# Patient Record
Sex: Male | Born: 1995 | Race: Black or African American | Hispanic: No | Marital: Single | Smoking: Never smoker
Health system: Southern US, Community
[De-identification: ages and names within clinical notes are randomized; demographics above are authoritative.]

## PROBLEM LIST (undated history)

## (undated) DIAGNOSIS — F909 Attention-deficit hyperactivity disorder, unspecified type: Secondary | ICD-10-CM

## (undated) DIAGNOSIS — D573 Sickle-cell trait: Secondary | ICD-10-CM

---
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
## 2014-05-25 ENCOUNTER — Telehealth: Payer: Self-pay | Admitting: Licensed Clinical Social Worker

## 2014-05-25 MED ORDER — LISDEXAMFETAMINE DIMESYLATE 40 MG PO CAPS: 40.0000 mg | ORAL_CAPSULE | Freq: Every day | ORAL | Status: DC

## 2014-05-25 NOTE — Telephone Encounter (Signed)
Will refill x 1 but please inform patient that future refills will require a visit.

## 2014-05-25 NOTE — Telephone Encounter (Signed)
TC from mother requesting refill of Vyvanse 40mg  for patient. Appointment made for next available date of 06/17/14 and added to cancellation list. Mother stated that patient has 1 week of medication left and wanted to know if a prescription can be written to hold patient until next appointment.

## 2014-05-25 NOTE — Addendum Note (Signed)
Addended by: Delorse LekPERRY, Melayah Skorupski F on: 05/25/2014 08:18 PM   Modules accepted: Orders, Medications

## 2014-05-26 ENCOUNTER — Telehealth: Payer: Self-pay

## 2014-05-26 NOTE — Telephone Encounter (Signed)
Called and left mom a VM that rx is ready for pick up and future prescriptions will require a follow up appointment.  He is scheduled for a follow up on 11/4 @ 2:30-gave reminder.

## 2014-06-17 ENCOUNTER — Ambulatory Visit: Payer: Medicaid Other | Admitting: Pediatrics

## 2014-06-23 ENCOUNTER — Encounter: Payer: Self-pay | Admitting: Pediatrics

## 2014-06-23 NOTE — Progress Notes (Signed)
Pre-Visit Planning  Previous Psych Screenings:  Vanderbilt completed by parent 05/27/13 Adult [name] ADD Scales  Completed on: 12/25/13 Cluster Subtotals: Activation: 23, T-Score 87 Attention: 22, T-Score 82 Effort: 26, T-Score 98 Affect: 11, T-Score 67 Memory: 14, T-Score 82 Total Score: 96, T-Score 91  Review of previous notes:  Last seen by Dr. Marina GoodellPerry on 12/25/13.  Treatment plan at last visit included continuing vyvanse, starting clonidine for sleep, retin-a for beard issues, claritin and flonase for anosmia. Has no-showed multiple appointments since then.   Last CPE: Due  Last STI screen: None Pertinent Labs: None  Immunizations Due: Flu Psych Screenings Due: ASRS  To Do at visit:   -ENT referral if continued anosmia -assess ADHD symptoms and school -sleep? -gc/chlamydia

## 2014-06-25 ENCOUNTER — Encounter: Payer: Self-pay | Admitting: Pediatrics

## 2014-06-25 ENCOUNTER — Ambulatory Visit (INDEPENDENT_AMBULATORY_CARE_PROVIDER_SITE_OTHER): Payer: Medicaid Other | Admitting: Pediatrics

## 2014-06-25 VITALS — BP 118/80 | Wt 224.4 lb

## 2014-06-25 DIAGNOSIS — F902 Attention-deficit hyperactivity disorder, combined type: Secondary | ICD-10-CM

## 2014-06-25 DIAGNOSIS — G479 Sleep disorder, unspecified: Secondary | ICD-10-CM

## 2014-06-25 MED ORDER — CLONIDINE HCL 0.1 MG PO TABS: 0.2000 mg | ORAL_TABLET | Freq: Every day | ORAL | Status: DC

## 2014-06-25 MED ORDER — LISDEXAMFETAMINE DIMESYLATE 50 MG PO CAPS: 50.0000 mg | ORAL_CAPSULE | Freq: Every day | ORAL | Status: DC

## 2014-06-25 NOTE — Progress Notes (Signed)
11:49 AM  Adolescent Medicine Consultation Follow-Up Visit Calvin Schneider  is a 18 y.o. male referred by Dr. Allayne GitelmanKavanaugh here today for follow-up of ADHD.   PCP Confirmed?  yes  Angelina PihKAVANAUGH,ALISON S, MD   History was provided by the patient and mother.   Expand All Collapse All   Pre-Visit Planning  Previous Psych Screenings: Vanderbilt completed by parent 05/27/13 Adult [name] ADD Scales  Completed on: 12/25/13 Cluster Subtotals: Activation: 23, T-Score 87 Attention: 22, T-Score 82 Effort: 26, T-Score 98 Affect: 11, T-Score 67 Memory: 14, T-Score 82 Total Score: 96, T-Score 91  Review of previous notes:  Last seen by Dr. Marina GoodellPerry on 12/25/13. Treatment plan at last visit included continuing vyvanse, starting clonidine for sleep, retin-a for beard issues, claritin and flonase for anosmia. Has no-showed multiple appointments since then.   Last CPE: Due  Last STI screen: None Pertinent Labs: None  Immunizations Due: Flu Psych Screenings Due: ASRS  To Do at visit:  -ENT referral if continued anosmia -assess ADHD symptoms and school -sleep? -gc/chlamydia         Growth Chart Viewed? not applicable  HPI:  Pt reports that he hasn't been needing his medicine since he hasn't been in school. He is starting school again next semester and anticipates taking it then. He will take entrance exams to start. When he was taking medications he was taking 2 of the 40 mg Vyvanse pills and 2 clonidine at night. He says he was unaware that it was written to be ONE daily. We discussed the importance of not taking 2.   He is not sleeping well. He is staying up all night watching TV. Goes to bed at 2-3 am. Sleeps till around 8 am. Doesn't have good sleep hygeine. Hoping to get back on a better schedule before school starts.   Mom feels like he needs to take more initiative to clean his room and fix meals for himself. He is not working right now. Mom hopes that he will get a job and be more  productive around the house so he can help her with bills now that he is over 18.  He wants to drive but mom is worried about his ADHD not being under control to be able to do that. We agreed to re-evaluate him in 1 month to see how things are with his ADHD.      ASRS: Off medications Top: 3/6 Bottom: 10/12   No LMP for male patient.  ROS:  Review of Systems  Constitutional: Negative for weight loss and malaise/fatigue.  Eyes: Negative for blurred vision.  Respiratory: Negative for shortness of breath.   Cardiovascular: Negative for chest pain and palpitations.  Gastrointestinal: Negative for nausea, vomiting, abdominal pain and constipation.  Genitourinary: Negative for dysuria.  Musculoskeletal: Negative for myalgias.  Neurological: Negative for dizziness and headaches.  Psychiatric/Behavioral: Negative for depression.     The following portions of the patient's history were reviewed and updated as appropriate: allergies, current medications, past social history and problem list.  No Known Allergies  Social History: Sleep:  As above Eating Habits: salads sometimes, some fruit  Screen Time:  A lot Exercise: lots of exercise. Plays basketball, works out and walks  School: Starting in January at [employer]  Future Plans: As above.   Confidentiality was discussed with the patient and if applicable, with caregiver as well.  Patient's personal or confidential phone number: [phone] Tobacco? no Secondhand smoke exposure?yes, a friend he spends a lot of time with  Drugs/EtOH?yes, recreational ETOH, denies drug use  Sexually active?no Pregnancy Prevention: , reviewed condoms & plan B Safe at home, in school & in relationships? Yes Guns in the home? no Safe to self? Yes  Physical Exam:  Filed Vitals:   06/25/14 1144  BP: 118/80  Weight: 224 lb 6.4 oz (101.787 kg)   BP 118/80 mmHg  Wt 224 lb 6.4 oz (101.787 kg) Body mass index: body mass index is 28.8 kg/(m^2). No  height on file for this encounter.  Physical Exam  Constitutional: He is oriented to person, place, and time. He appears well-developed and well-nourished.  Cardiovascular: Normal rate and regular rhythm.   Abdominal: Soft. Bowel sounds are normal.  Musculoskeletal: Normal range of motion.  Neurological: He is alert and oriented to person, place, and time.  Skin: Skin is warm and dry.  Psychiatric: He has a normal mood and affect. His behavior is normal.    Assessment/Plan: 1. Attention deficit hyperactivity disorder (ADHD), combined type He was taking 2 40 mg Vyvanse pills prior to them running out which I explained was too high of a dose. We will increase slightly from 40 mg to 50 mg to hopefully have appropriate effect but I have told him it must ONLY be once a day.  - lisdexamfetamine (VYVANSE) 50 MG capsule; Take 1 capsule (50 mg total) by mouth daily.  Dispense: 30 capsule; Refill: 0  2. Sleep disturbance He was also taking 2 clonidine at night instead of BID so we will change it to be dosed that way.  - lisdexamfetamine (VYVANSE) 50 MG capsule; Take 1 capsule (50 mg total) by mouth daily.  Dispense: 30 capsule; Refill: 0 - cloNIDine (CATAPRES) 0.1 MG tablet; Take 2 tablets (0.2 mg total) by mouth at bedtime.  Dispense: 60 tablet; Refill: 3  He needs a gc/chlamydia screen at his next appointment. It doesn't appear that this had been done here and he wasn't able to pee in a cup today for us.    Follow-up:  1 month with Dr. Marina GoodellPerry for follow up of medications   Medical decision-making:  > 25 minutes spent, more than 50% of appointment was spent discussing diagnosis and management of symptoms

## 2014-06-25 NOTE — Patient Instructions (Signed)
Take your Vyvanse once in the morning. Don't take more than one.   Take the Clonidine at night. You may take two pills. Don't take it during the day.   Discussed importance of diet, exercise, sleep and limited screen time.

## 2014-06-26 ENCOUNTER — Telehealth: Payer: Self-pay

## 2014-06-26 NOTE — Telephone Encounter (Signed)
Calvin Schneider with [facility] on [address] called stating the PHARMACY misplaced [name] rx for Vyvanse 40 mg.  He would like a call back ASAP @ [phone].  Please advise.

## 2014-06-26 NOTE — Telephone Encounter (Signed)
They need to file a police report and call with the file number before we can replace it.  Please notify them.

## 2014-06-30 NOTE — Telephone Encounter (Signed)
Called and left a VM for Kathlene NovemberMike that a police file number is required.  Please call us with that file number and we can issue a new rx for the Vyvanse 40 mg.

## 2014-06-30 NOTE — Telephone Encounter (Signed)
Huntley DecSara called stating the patient had picked up the prescription on 11/12 and she will clarify the call with Kathlene NovemberMike.

## 2014-07-22 ENCOUNTER — Encounter: Payer: Self-pay | Admitting: Pediatrics

## 2014-07-22 NOTE — Progress Notes (Signed)
Pre-Visit Planning  Previous Psych Screenings:  Previous Psych Screenings: Vanderbilt completed by parent 05/27/13 Adult [name] ADD Scales  Completed on: 12/25/13 Cluster Subtotals: Activation: 23, T-Score 87 Attention: 22, T-Score 82 Effort: 26, T-Score 98 Affect: 11, T-Score 67 Memory: 14, T-Score 82 Total Score: 96, T-Score 91  Psych Screenings Due: ASRS if he is taking Vyvanse   Review of previous notes:  [facility] on 06[facility]25/14.  Treatment plan at last visit included restarting vyvanse and clonidine since he was starting school soon. According to a phone call he or the pharmacy evidently "lost" the prescription. Not sure what the resolution of this was.    Last CPE: Due  Last STI screen: Due Pertinent Labs: None  Immunizations Due: None  To Do at visit:  Discuss medication effect if he is taking it. Gc/chlamydia screening. Schedule CPE.

## 2014-07-23 ENCOUNTER — Ambulatory Visit: Payer: Medicaid Other | Admitting: Pediatrics

## 2014-07-29 ENCOUNTER — Encounter: Payer: Self-pay | Admitting: Licensed Clinical Social Worker

## 2014-07-29 ENCOUNTER — Encounter: Payer: Self-pay | Admitting: Pediatrics

## 2014-07-29 ENCOUNTER — Ambulatory Visit (INDEPENDENT_AMBULATORY_CARE_PROVIDER_SITE_OTHER): Payer: Medicaid Other | Admitting: Pediatrics

## 2014-07-29 VITALS — BP 118/78 | Ht 74.5 in | Wt 223.0 lb

## 2014-07-29 DIAGNOSIS — F902 Attention-deficit hyperactivity disorder, combined type: Secondary | ICD-10-CM

## 2014-07-29 DIAGNOSIS — Z1389 Encounter for screening for other disorder: Secondary | ICD-10-CM

## 2014-07-29 DIAGNOSIS — Z113 Encounter for screening for infections with a predominantly sexual mode of transmission: Secondary | ICD-10-CM

## 2014-07-29 DIAGNOSIS — Z23 Encounter for immunization: Secondary | ICD-10-CM

## 2014-07-29 DIAGNOSIS — G479 Sleep disorder, unspecified: Secondary | ICD-10-CM

## 2014-07-29 MED ORDER — LISDEXAMFETAMINE DIMESYLATE 60 MG PO CAPS: 60.0000 mg | ORAL_CAPSULE | ORAL | Status: DC

## 2014-07-29 NOTE — Progress Notes (Signed)
Met with patient and mother briefly (about 10 minutes) to discuss resources in the community. Mother and patient wanted information on job resources. Provided information on [employer] job preparedness training as well as Psychiatrist.   Also discussed options for counseling agencies. Mother wanted referral to [facility]. [facility] explained that [facility] does not take patient's insurance. [facility] offered as alternative as family therapy can be provided and they accept Medicaid. Mother and patient agreed to plan. Referral sent.

## 2014-07-29 NOTE — Progress Notes (Signed)
11:38 AM  Adolescent Medicine Consultation Follow-Up Visit Calvin Schneider  is a 18 y.o. male referred by Dr. Allayne GitelmanKavanaugh here today for follow-up of ADHD.   PCP Confirmed?  yes  Angelina PihKAVANAUGH,ALISON S, MD   History was provided by the patient and mother.  Pre-Visit Planning  Previous Psych Screenings: Previous Psych Screenings: Vanderbilt completed by parent 05/27/13 Adult [name] ADD Scales  Completed on: 12/25/13 Cluster Subtotals: Activation: 23, T-Score 87 Attention: 22, T-Score 82 Effort: 26, T-Score 98 Affect: 11, T-Score 67 Memory: 14, T-Score 82 Total Score: 96, T-Score 91  Psych Screenings Due: ASRS if he is taking Vyvanse   Review of previous notes:  Last seen in [facility] on 06/25/14. Treatment plan at last visit included restarting vyvanse and clonidine since he was starting school soon. According to a phone call he or the pharmacy evidently "lost" the prescription. Not sure what the resolution of this was.   Last CPE: Due  Last STI screen: Due Pertinent Labs: None  Immunizations Due: None  To Do at visit: Discuss medication effect if he is taking it. Gc/chlamydia screening. Schedule CPE.   Growth Chart Viewed? not applicable  HPI:  Pt reports that since the last time we saw him the medicine was working really well. When he takes the clonidine it's working well when he remembers to take it.    He still took 2 of the 50 mg pills a few times. He reports that his focus was better. He says he understands that he shouldn't take more than one but he has done this again anyway. We discussed that this is not a safe practice.   He still hasn't practiced driving. Mom won't let him because she is worried about teen drivers. He is using his focus for his own personal purposes but not to help mom. Mom feels like they need help from a counselor so she can have some parenting skills and know how to help him get motivated.    No LMP for male patient.  ROS:   Review of Systems  Constitutional: Negative for weight loss and malaise/fatigue.  Eyes: Negative for blurred vision.  Respiratory: Negative for shortness of breath.   Cardiovascular: Negative for chest pain and palpitations.  Gastrointestinal: Negative for nausea, vomiting, abdominal pain and constipation.  Genitourinary: Negative for dysuria.  Musculoskeletal: Negative for myalgias.  Neurological: Negative for dizziness and headaches.  Psychiatric/Behavioral: Negative for depression.     The following portions of the patient's history were reviewed and updated as appropriate: allergies, current medications, past family history, past medical history, past social history and problem list.  No Known Allergies  Social History: Sleep:  Sleeping well when he takes clonidine  Screen Time:  Excessive video games School: Working on applying Future Plans: Unsure  Physical Exam:  Filed Vitals:   07/29/14 1122  BP: 118/78  Height: 6' 2.5" (1.892 m)  Weight: 223 lb (101.152 kg)   BP 118/78 mmHg  Ht 6' 2.5" (1.892 m)  Wt 223 lb (101.152 kg)  BMI 28.26 kg/m2 Body mass index: body mass index is 28.26 kg/(m^2). Blood pressure percentiles are 23% systolic and 51% diastolic based on 2000 NHANES data. Blood pressure percentile targets: 90: 140/92, 95: 143/97, 99 + 5 mmHg: 156/110.  Physical Exam  Constitutional: He is oriented to person, place, and time. He appears well-developed.  HENT:  Head: Normocephalic.  Neck: No thyromegaly present.  Cardiovascular: Normal rate, regular rhythm, normal heart sounds and intact distal pulses.   Pulmonary/Chest:  Effort normal and breath sounds normal.  Abdominal: Soft. Bowel sounds are normal.  Musculoskeletal: Normal range of motion.  Lymphadenopathy:    He has no cervical adenopathy.  Neurological: He is alert and oriented to person, place, and time.  Skin: Skin is warm and dry.  Psychiatric: He has a normal mood and affect.  Vitals  reviewed.   Assessment/Plan: 1. Attention deficit hyperactivity disorder (ADHD), combined type BH referral to counseling. Increase medication slightly. Discussed not taking more than one. Discussed that meds will not help with motivation and be used for tasks like driving and working or school.  - lisdexamfetamine (VYVANSE) 60 MG capsule; Take 1 capsule (60 mg total) by mouth every morning.  Dispense: 30 capsule; Refill: 0 - Ambulatory referral to Behavioral Health  2. Sleep disturbance Continue clonidine and take more consistently.   3. Screen for STD (sexually transmitted disease) Per clinic policy.  - GC/chlamydia probe amp, urine  4. Screening for genitourinary condition Per clinic policy for other substances.  - Drug Screen, Urine  5. Need for prophylactic vaccination and inoculation against influenza Per flu season policy.  - Flu vaccine nasal quad   Follow-[facility]:  [facility]   Medical decision-making:  > 25 minutes spent, more than 50% of appointment was spent discussing diagnosis and management of symptoms

## 2014-07-29 NOTE — Patient Instructions (Addendum)
We will see you back again in a month. Calvin Schneider will help you get scheduled with [facility].   We have refilled your medication. It is really important for your health that you only take one pill at a time. It can cause severe heart problems if you take too much.   Keep working toward your goal of school or work!

## 2014-07-30 ENCOUNTER — Encounter: Payer: Self-pay | Admitting: Pediatrics

## 2014-07-30 LAB — DRUG SCREEN, URINE
Amphetamine Screen, Ur: NEGATIVE
BENZODIAZEPINES.: NEGATIVE
Barbiturate Quant, Ur: NEGATIVE
Cocaine Metabolites: NEGATIVE
Creatinine,U: 441.09 mg/dL
MARIJUANA METABOLITE: NEGATIVE
METHADONE: NEGATIVE
OPIATES: NEGATIVE
PHENCYCLIDINE (PCP): NEGATIVE
Propoxyphene: NEGATIVE

## 2014-07-30 LAB — GC/CHLAMYDIA PROBE AMP, URINE
Chlamydia, Swab/Urine, PCR: NEGATIVE
GC Probe Amp, Urine: NEGATIVE

## 2014-08-28 ENCOUNTER — Ambulatory Visit: Payer: Medicaid Other | Admitting: Pediatrics

## 2014-10-08 ENCOUNTER — Ambulatory Visit: Payer: Medicaid Other | Admitting: Pediatrics

## 2014-11-13 ENCOUNTER — Other Ambulatory Visit: Payer: Self-pay | Admitting: *Deleted

## 2014-11-13 NOTE — Telephone Encounter (Signed)
Patient has had two no shows. We will not refill medication at this time until he comes for his scheduled visit on 4/14. Please remind patient of this appointment and relay this to him.

## 2014-11-13 NOTE — Telephone Encounter (Signed)
CALL BACK NUMBER:  [phone]  MEDICATION(S): lisdexamfetamine (VYVANSE) 60 MG capsule  [facility] [address]  ARE YOU CURRENTLY COMPLETELY OUT OF THE MEDICATION? :  yes

## 2014-11-16 NOTE — Telephone Encounter (Addendum)
TC to patient. VM left that pt has had two no shows. We will not refill medication at this time until he comes for his scheduled visit on 4/14. Reminded patient of this appointment, callback given.

## 2014-11-26 ENCOUNTER — Encounter: Payer: Self-pay | Admitting: Pediatrics

## 2014-11-26 ENCOUNTER — Encounter (INDEPENDENT_AMBULATORY_CARE_PROVIDER_SITE_OTHER): Payer: Self-pay

## 2014-11-26 ENCOUNTER — Ambulatory Visit (INDEPENDENT_AMBULATORY_CARE_PROVIDER_SITE_OTHER): Payer: Medicaid Other | Admitting: Pediatrics

## 2014-11-26 VITALS — BP 128/82 | Ht 74.41 in | Wt 231.0 lb

## 2014-11-26 DIAGNOSIS — G479 Sleep disorder, unspecified: Secondary | ICD-10-CM | POA: Diagnosis not present

## 2014-11-26 DIAGNOSIS — F902 Attention-deficit hyperactivity disorder, combined type: Secondary | ICD-10-CM

## 2014-11-26 MED ORDER — CLONIDINE HCL 0.1 MG PO TABS: 0.2000 mg | ORAL_TABLET | Freq: Every day | ORAL | Status: DC

## 2014-11-26 MED ORDER — LISDEXAMFETAMINE DIMESYLATE 60 MG PO CAPS: 60.0000 mg | ORAL_CAPSULE | ORAL | Status: DC

## 2014-11-26 NOTE — Progress Notes (Signed)
Adolescent Medicine Consultation Follow-Up Visit Calvin Schneider  is a 19 y.o. male referred by Angelina Pih, MD here today for follow-up of ADHD.   PCP Confirmed?  Needs referral to [facility] for transition to adult medicine   Previsit planning completed:  yes  Growth Chart Viewed? yes   History was provided by the patient and mother.  HPI:  He has been off Vyvanse for a while. They didn't come to the last appointment because his mom and sister were sick. He has been working hard to get a job. He is going back next semester to [employer]. He is more thirsty and less hungry when taking Vyvanse. He has been using some marijuana and alcohol since he has been off Vyvanse but knows that he should refrain from these things when he is using his ADHD meds.   He is hopeful to get his license this summer. He has been helping more around the house. Mom feels like he is more mature.   No LMP for male patient.  The following portions of the patient's history were reviewed and updated as appropriate: allergies, current medications, past family history, past medical history, past social history and problem list.  No Known Allergies   Review of Systems  Constitutional: Negative for weight loss and malaise/fatigue.  Eyes: Negative for blurred vision.  Respiratory: Negative for shortness of breath.   Cardiovascular: Negative for chest pain and palpitations.  Gastrointestinal: Negative for nausea, vomiting, abdominal pain and constipation.  Genitourinary: Negative for dysuria.  Musculoskeletal: Negative for myalgias.  Neurological: Negative for dizziness and headaches.  Psychiatric/Behavioral: Negative for depression.     Social History: School: Trying to go to community college next semester Future Plans: college and work Sleep: sleeps through the night  Confidentiality was discussed with the patient and if applicable, with caregiver as well.  Patient's personal or confidential  phone number: [phone] Tobacco? no Drugs/ETOH? yes, marijuana, alcohol Sexually active? no Partner preference?  male Safe to self? Yes  Physical Exam:  Filed Vitals:   11/26/14 0913  BP: 128/82  Height: 6' 2.41" (1.89 m)  Weight: 231 lb (104.781 kg)   BP 128/82 mmHg  Ht 6' 2.41" (1.89 m)  Wt 231 lb (104.781 kg)  BMI 29.33 kg/m2 Body mass index: body mass index is 29.33 kg/(m^2). Blood pressure percentiles are 57% systolic and 59% diastolic based on 2000 NHANES data. Blood pressure percentile targets: 90: 140/94, 95: 144/98, 99 + 5 mmHg: 156/111.  Physical Exam  Constitutional: He is oriented to person, place, and time. He appears well-developed.  HENT:  Head: Normocephalic.  Neck: No thyromegaly present.  Cardiovascular: Normal rate, regular rhythm, normal heart sounds and intact distal pulses.   Pulmonary/Chest: Effort normal and breath sounds normal.  Musculoskeletal: Normal range of motion.  Lymphadenopathy:    He has no cervical adenopathy.  Neurological: He is alert and oriented to person, place, and time.  Skin: Skin is warm and dry.  Psychiatric: He has a normal mood and affect.  Vitals reviewed.    Assessment/Plan: 1. Attention deficit hyperactivity disorder (ADHD), combined type Continue Vyvanse 60 mg. Continue clonidine at bedtime. Could consider change to Intuniv if this doesn't seem to be the best combination.  - lisdexamfetamine (VYVANSE) 60 MG capsule; Take 1 capsule (60 mg total) by mouth every morning.  Dispense: 30 capsule; Refill: 0  2. Sleep disturbance Continue clonidine at bedtime.    Follow-up:  1 month   Medical decision-making:  > 15 minutes  spent, more than 50% of appointment was spent discussing diagnosis and management of symptoms

## 2014-11-26 NOTE — Patient Instructions (Addendum)
[facility]  [phone] [address] [place], [place] [address]  Please make an appointment for Calvin Schneider to have a physical. It appears to have been quite some time since he's had one. He will need to have one so we can continue to prescribe ADHD medications for him. Now that he is [age], it is a good time to transition to adult medicine.   We will see you back in 1 month to evaluate how you are doing. If you can keep this appointment and are doing well, we can space you out to 2 months in between.

## 2014-12-24 ENCOUNTER — Telehealth: Payer: Self-pay | Admitting: *Deleted

## 2014-12-24 ENCOUNTER — Ambulatory Visit: Payer: Medicaid Other | Admitting: Pediatrics

## 2014-12-24 NOTE — Telephone Encounter (Signed)
Mom called, requesting refill on Vyvanse.   Pt had NS appt this am, and has had three total NS appts since January.  F/u appt is scheduled for 5/27.

## 2015-01-08 ENCOUNTER — Ambulatory Visit: Payer: Medicaid Other | Admitting: Pediatrics

## 2015-01-08 ENCOUNTER — Encounter (INDEPENDENT_AMBULATORY_CARE_PROVIDER_SITE_OTHER): Payer: Self-pay

## 2015-01-14 ENCOUNTER — Encounter: Payer: Self-pay | Admitting: Pediatrics

## 2015-01-14 NOTE — Progress Notes (Signed)
Pre-Visit Planning  Review of previous notes:  Calvin Schneider  is a 19 y.o. male referred by Angelina PihKAVANAUGH,ALISON S, MD.   Last seen in [facility] on 11/26/14 for ADHD.  Treatment plan at last visit included continuing Vyvanse, clonidine at bedtime. Discussed importance of showing up for visits. Patient then no-showed again. Front desk has given them final warning.    Previous Psych Screenings?  yes Psych Screenings Due? Yes; ASRS, parent Vanderbilt   STI screen in the past year? yes Pertinent Labs? no  Clinical Staff Visit Tasks:   -ASRS & parent vanderbilt   Provider Visit Tasks: -? Job -license  -counseling referral ([facility] tried to contact)  -transition to adult care

## 2015-01-15 ENCOUNTER — Encounter (INDEPENDENT_AMBULATORY_CARE_PROVIDER_SITE_OTHER): Payer: Self-pay

## 2015-01-15 ENCOUNTER — Ambulatory Visit (INDEPENDENT_AMBULATORY_CARE_PROVIDER_SITE_OTHER): Payer: Medicaid Other | Admitting: Pediatrics

## 2015-01-15 ENCOUNTER — Encounter: Payer: Self-pay | Admitting: Pediatrics

## 2015-01-15 VITALS — BP 127/81 | HR 80 | Ht 74.0 in | Wt 224.0 lb

## 2015-01-15 DIAGNOSIS — G479 Sleep disorder, unspecified: Secondary | ICD-10-CM | POA: Diagnosis not present

## 2015-01-15 DIAGNOSIS — F902 Attention-deficit hyperactivity disorder, combined type: Secondary | ICD-10-CM | POA: Diagnosis not present

## 2015-01-15 MED ORDER — LISDEXAMFETAMINE DIMESYLATE 60 MG PO CAPS: 60.0000 mg | ORAL_CAPSULE | ORAL | Status: AC

## 2015-01-15 MED ORDER — LISDEXAMFETAMINE DIMESYLATE 60 MG PO CAPS
60.0000 mg | ORAL_CAPSULE | ORAL | Status: DC
Start: 2015-01-15 — End: 2015-01-15

## 2015-01-15 MED ORDER — GUANFACINE HCL ER 2 MG PO TB24: 2.0000 mg | ORAL_TABLET | Freq: Every day | ORAL | Status: AC

## 2015-01-15 NOTE — Patient Instructions (Addendum)
We will change your bedtime medicine to Intuniv. Take it consistently at the same time every night. If you are having dizziness when you stand up, please let us know. Make sure you are drinking enough water with this medicine.   We will see you in 2 months! Please make sure you are here for that appointment or cancel at least 24 hours before. Failure to show up for this appointment will result in dismissal from adolescent clinic due to prior no shows.   Guanfacine extended-release oral tablets What is this medicine? GUANFACINE [facility] fa seen) is used to treat attention-deficit hyperactivity disorder (ADHD). This medicine may be used for other purposes; ask your health care provider or pharmacist if you have questions. COMMON BRAND NAME(S): Intuniv What should I tell my health care provider before I take this medicine? They need to know if you have any of these conditions: -heart disease -kidney disease -liver disease -low blood pressure or slow heart rate -an unusual or allergic reaction to guanfacine, other medicines, foods, dyes, or preservatives -pregnant or breast-feeding How should I use this medicine? Take this medicine by mouth with a glass of water. Follow the directions on the prescription label. Do not cut, crush or chew this medicine. Do not take this medicine with a high-fat meal. Take your medicine at regular intervals. Do not take it more often than directed. Do not stop taking except on your doctor's advice. This drug may be prescribed for children as young as 6 years. Talk to your doctor if you have any questions. Overdosage: If you think you've taken too much of this medicine contact a poison control center or emergency room at once. Overdosage: If you think you have taken too much of this medicine contact a poison control center or emergency room at once. NOTE: This medicine is only for you. Do not share this medicine with others. What if I miss a dose? If you miss a dose,  take it as soon as you can. If it is almost time for your next dose, take only that dose. Do not take double or extra doses. If you miss 2 or more doses in a row, you should contact your doctor or health care professional. You may need to restart your medicine at a lower dose. What may interact with this medicine? -certain medicines for anxiety or sleep -certain medicines for blood pressure, heart disease, irregular heart beat -certain medicines for depression, anxiety, or psychotic disturbances -certain medicines for seizures like carbamazepine, phenobarbital, phenytoin -ketoconazole -narcotic medicines for pain -rifampin This list may not describe all possible interactions. Give your health care provider a list of all the medicines, herbs, non-prescription drugs, or dietary supplements you use. Also tell them if you smoke, drink alcohol, or use illegal drugs. Some items may interact with your medicine. What should I watch for while using this medicine? Visit your doctor or health care professional for regular checks on your progress. Check your heart rate and blood pressure as directed. Ask your doctor or health care professional what your heart rate and blood pressure should be and when you should contact him or her. You may get drowsy or dizzy. Do not ride a bicycle, drive, or do anything that needs mental alertness until you know how this medicine affects you. Do not stand or sit up quickly. This reduces the risk of dizzy or fainting spells. Avoid cough and cold medicines, alcohol, and activities that may make you drowsy or dizzy. Avoid becoming dehydrated or overheated  while taking this medicine. Your mouth may get dry. Chewing sugarless gum or sucking hard candy, and drinking plenty of water may help. Contact your doctor if the problem does not go away or is severe. What side effects may I notice from receiving this medicine? Side effects that you should report to your doctor or health care  professional as soon as possible: -allergic reactions like skin rash, itching or hives, swelling of the face, lips, or tongue -changes in emotions or moods -chest pain or chest tightness -signs and symptoms of low blood pressure like dizziness; feeling faint or lightheaded, falls; unusually weak or tired -unusually slow heartbeat Side effects that usually do not require medical attention (Report these to your doctor or health care professional if they continue or are bothersome.): -dizziness -dry mouth -irritability -headache -loss of appetite -nausea, vomiting -stomach pain -tiredness -trouble sleeping This list may not describe all possible side effects. Call your doctor for medical advice about side effects. You may report side effects to FDA at 1-800-FDA-1088. Where should I keep my medicine? Keep out of the reach of children. Store at room temperature between 15 and 30 degrees C (59 and 86 degrees F). Throw away any unused medicine after the expiration date. NOTE: This sheet is a summary. It may not cover all possible information. If you have questions about this medicine, talk to your doctor, pharmacist, or health care provider.  2015, [employer]. (2013-07-09 12:53:57)

## 2015-01-15 NOTE — Progress Notes (Signed)
Adolescent Medicine Consultation Follow-Up Visit Calvin Schneider  is a 19 y.o. male referred by Angelina Pih, MD here today for follow-up of ADHD.   Previsit planning completed:  yes  Pre-Visit Planning  Review of previous notes:  Calvin Schneider is a 19 y.o. male referred by Angelina Pih, MD.  Last seen in [facility] on 11/26/14 for ADHD. Treatment plan at last visit included continuing Vyvanse, clonidine at bedtime. Discussed importance of showing up for visits. Patient then no-showed again. Front desk has given them final warning.   Previous Psych Screenings? yes Psych Screenings Due? Yes; ASRS, parent Vanderbilt   STI screen in the past year? yes Pertinent Labs? no  Clinical Staff Visit Tasks:  -ASRS & parent vanderbilt   Provider Visit Tasks: -? Job -license  -counseling referral ([facility] tried to contact)  -transition to adult care    Growth Chart Viewed? yes  PCP Confirmed?  yes   History was provided by the patient and mother.  HPI:  Vyvanse has been going well. Keeps him very focused. He is not going to bed until 4 am by choice. He feels like the clonidine helps some. He frequently wakes up during the night with the clonidine though and would like to do something different. Playing basketball, applying for jobs and playing video games during the day. He has had so many interview but no hires. [employer] is still hiring but hasn't been willing to hire him. He is still feeling positive and trying hard.   ASRS Top: 4/6 Bottom: 7/11  [facility] Vanderbilt Assessment Scale, Parent Informant  Completed by: mother  Date Completed: 01/15/2015    Results Total number of questions score 2 or 3 in questions #1-9 (Inattention): 8 Total number of questions score 2 or 3 in questions #10-18 (Hyperactive/Impulsive):   3 Total Symptom Score for questions #1-18: 11 Total number of questions scored 2 or 3 in questions #19-40  (Oppositional/Conduct):  0 Total number of questions scored 2 or 3 in questions #41-43 (Anxiety Symptoms): 1 Total number of questions scored 2 or 3 in questions #44-47 (Depressive Symptoms): 0  Performance (1 is excellent, 2 is above average, 3 is average, 4 is somewhat of a problem, 5 is problematic) Overall School Performance:   4 Relationship with parents:   2 Relationship with siblings:  3 Relationship with peers:  1  Participation in organized activities:   4     No LMP for male patient.  The following portions of the patient's history were reviewed and updated as appropriate: allergies, current medications, past family history, past medical history, past social history and problem list.  Review of Systems  Constitutional: Negative for weight loss and malaise/fatigue.  Eyes: Negative for blurred vision.  Respiratory: Negative for shortness of breath.   Cardiovascular: Negative for chest pain and palpitations.  Gastrointestinal: Negative for nausea, vomiting, abdominal pain and constipation.  Genitourinary: Negative for dysuria.  Musculoskeletal: Negative for myalgias.  Neurological: Negative for dizziness and headaches.  Psychiatric/Behavioral: Negative for depression.     No Known Allergies  Social History: Sleep:  As above  Eating Habits: Eating well- tries to eat breakfast before he takes Vyvanse Exercise: basketball, gym  School: Graduated  Future Plans: Looking for job, getting drivers license   Physical Exam:  Filed Vitals:   01/15/15 0916  BP: 127/81  Pulse: 80  Height:  (1.88 m)  Weight: 224 lb (101.606 kg)   BP 127/81 mmHg  Pulse 80  Ht  (  1.88 m)  Wt 224 lb (101.606 kg)  BMI 28.75 kg/m2 Body mass index: body mass index is 28.75 kg/(m^2). Blood pressure percentiles are 54% systolic and 54% diastolic based on 2000 NHANES data. Blood pressure percentile targets: 90: 140/95, 95: 143/99, 99 + 5 mmHg: 156/112.  Physical Exam  Constitutional:  He is oriented to person, place, and time. He appears well-developed.  HENT:  Head: Normocephalic.  Neck: No thyromegaly present.  Cardiovascular: Normal rate, regular rhythm, normal heart sounds and intact distal pulses.   Pulmonary/Chest: Effort normal and breath sounds normal.  Abdominal: Soft. Bowel sounds are normal.  Musculoskeletal: Normal range of motion.  Lymphadenopathy:    He has no cervical adenopathy.  Neurological: He is alert and oriented to person, place, and time.  Skin: Skin is warm and dry.  Psychiatric: He has a normal mood and affect.  Vitals reviewed.   Assessment/Plan: 1. Attention deficit hyperactivity disorder (ADHD), combined type Continue Vyvanse. Switch to intuniv to help with sleep and continued ADHD symptoms as per ASRS.  - guanFACINE (INTUNIV) 2 MG TB24 SR tablet; Take 1 tablet (2 mg total) by mouth daily.  Dispense: 30 tablet; Refill: 3 - lisdexamfetamine (VYVANSE) 60 MG capsule; Take 1 capsule (60 mg total) by mouth every morning.  Dispense: 30 capsule; Refill: 0 - lisdexamfetamine (VYVANSE) 60 MG capsule; Take 1 capsule (60 mg total) by mouth every morning.  Dispense: 30 capsule; Refill: 0  2. Sleep disturbance Switch to intuniv. Given that he was already on 0.2 mg of clonidine (short acting), will do 2 mg of intuniv. Discussed side effects.  - guanFACINE (INTUNIV) 2 MG TB24 SR tablet; Take 1 tablet (2 mg total) by mouth daily.  Dispense: 30 tablet; Refill: 3   Follow-up:  2 months   Medical decision-making:  > 25 minutes spent, more than 50% of appointment was spent discussing diagnosis and management of symptoms

## 2015-03-17 ENCOUNTER — Ambulatory Visit: Payer: Medicaid Other | Admitting: Pediatrics

## 2015-03-19 ENCOUNTER — Ambulatory Visit: Payer: Medicaid Other | Admitting: Pediatrics

## 2016-03-08 ENCOUNTER — Encounter: Payer: Self-pay | Admitting: Pediatrics

## 2016-03-09 ENCOUNTER — Encounter: Payer: Self-pay | Admitting: Pediatrics
